# Patient Record
Sex: Female | Born: 1960 | Race: White | Hispanic: No | Marital: Married | Smoking: Former smoker
Health system: Southern US, Community
[De-identification: ages and names within clinical notes are randomized; demographics above are authoritative.]

## PROBLEM LIST (undated history)

## (undated) DIAGNOSIS — R002 Palpitations: Secondary | ICD-10-CM

## (undated) DIAGNOSIS — I2510 Atherosclerotic heart disease of native coronary artery without angina pectoris: Secondary | ICD-10-CM

## (undated) DIAGNOSIS — E785 Hyperlipidemia, unspecified: Secondary | ICD-10-CM

## (undated) DIAGNOSIS — I10 Essential (primary) hypertension: Secondary | ICD-10-CM

## (undated) HISTORY — DX: Essential (primary) hypertension: I10

## (undated) HISTORY — DX: Hyperlipidemia, unspecified: E78.5

## (undated) HISTORY — PX: REDUCTION MAMMAPLASTY: SUR839

## (undated) HISTORY — PX: FOOT SURGERY: SHX648

## (undated) HISTORY — PX: KNEE SURGERY: SHX244

## (undated) HISTORY — DX: Palpitations: R00.2

## (undated) HISTORY — DX: Atherosclerotic heart disease of native coronary artery without angina pectoris: I25.10

---

## 1998-01-24 ENCOUNTER — Other Ambulatory Visit: Admission: RE | Admit: 1998-01-24 | Discharge: 1998-01-24 | Payer: Self-pay | Admitting: Obstetrics and Gynecology

## 1999-02-17 ENCOUNTER — Other Ambulatory Visit: Admission: RE | Admit: 1999-02-17 | Discharge: 1999-02-17 | Payer: Self-pay | Admitting: Obstetrics and Gynecology

## 2000-03-09 ENCOUNTER — Other Ambulatory Visit: Admission: RE | Admit: 2000-03-09 | Discharge: 2000-03-09 | Payer: Self-pay | Admitting: Obstetrics and Gynecology

## 2000-04-09 ENCOUNTER — Encounter: Admission: RE | Admit: 2000-04-09 | Discharge: 2000-04-09 | Payer: Self-pay | Admitting: Obstetrics and Gynecology

## 2000-04-09 ENCOUNTER — Encounter: Payer: Self-pay | Admitting: Obstetrics and Gynecology

## 2001-04-05 ENCOUNTER — Other Ambulatory Visit: Admission: RE | Admit: 2001-04-05 | Discharge: 2001-04-05 | Payer: Self-pay | Admitting: Obstetrics and Gynecology

## 2001-09-07 ENCOUNTER — Encounter: Payer: Self-pay | Admitting: Obstetrics and Gynecology

## 2001-09-07 ENCOUNTER — Encounter: Admission: RE | Admit: 2001-09-07 | Discharge: 2001-09-07 | Payer: Self-pay | Admitting: Obstetrics and Gynecology

## 2002-06-15 ENCOUNTER — Other Ambulatory Visit: Admission: RE | Admit: 2002-06-15 | Discharge: 2002-06-15 | Payer: Self-pay | Admitting: Obstetrics and Gynecology

## 2003-04-17 ENCOUNTER — Ambulatory Visit: Admission: RE | Admit: 2003-04-17 | Discharge: 2003-04-17 | Payer: Self-pay | Admitting: Gynecology

## 2003-09-12 ENCOUNTER — Other Ambulatory Visit: Admission: RE | Admit: 2003-09-12 | Discharge: 2003-09-12 | Payer: Self-pay | Admitting: Obstetrics and Gynecology

## 2004-10-02 ENCOUNTER — Other Ambulatory Visit: Admission: RE | Admit: 2004-10-02 | Discharge: 2004-10-02 | Payer: Self-pay | Admitting: Obstetrics and Gynecology

## 2005-11-12 ENCOUNTER — Encounter: Admission: RE | Admit: 2005-11-12 | Discharge: 2005-11-12 | Payer: Self-pay | Admitting: Gastroenterology

## 2005-12-17 ENCOUNTER — Ambulatory Visit (HOSPITAL_COMMUNITY): Admission: RE | Admit: 2005-12-17 | Discharge: 2005-12-17 | Payer: Self-pay | Admitting: Obstetrics and Gynecology

## 2005-12-18 ENCOUNTER — Ambulatory Visit: Admission: RE | Admit: 2005-12-18 | Discharge: 2005-12-18 | Payer: Self-pay | Admitting: Gynecology

## 2009-07-04 ENCOUNTER — Encounter: Admission: RE | Admit: 2009-07-04 | Discharge: 2009-07-04 | Payer: Self-pay | Admitting: Obstetrics and Gynecology

## 2010-02-25 ENCOUNTER — Encounter: Admission: RE | Admit: 2010-02-25 | Discharge: 2010-02-25 | Payer: Self-pay | Admitting: Obstetrics and Gynecology

## 2010-05-18 ENCOUNTER — Encounter: Payer: Self-pay | Admitting: Gastroenterology

## 2010-05-18 ENCOUNTER — Encounter: Payer: Self-pay | Admitting: Obstetrics and Gynecology

## 2010-09-12 NOTE — Consult Note (Signed)
NAME:  Elizabeth Johnston, Elizabeth Johnston NO.:  [id number]   MEDICAL RECORD NO.:  [id number]          PATIENT TYPE:  OUT   LOCATION:  GYN                          FACILITY:  [facility]   PHYSICIAN:  De Blanch, M.D.DATE OF BIRTH:  [birth year]   DATE OF CONSULTATION:  DATE OF DISCHARGE:                                   CONSULTATION   CHIEF COMPLAINT:  Elevated CA-125.   HISTORY OF PRESENT ILLNESS:  Elizabeth Johnston returns for reevaluation regarding an  elevated CA-125.  I initially saw her in December of 2004 at which time she  had a CA-125 of 79 with 58 units/mL.  Apparently, the CA-125 had been done  as a screening test.  Evaluation at that time suggested that her CA-125 was  likely associated with her uterine fibroids.  She is being followed by Dr.  Arelia Sneddon who recently obtained a CA-125 which was 43 units/mL.  Subsequently,  the patient has had ultrasound of the pelvis showing two small uterine  fibroids, normal left ovary, and her right ovary was not visualized.  There  was no free pelvic fluid.   She is currently having regular cyclic menstrual periods, taking Yaz.   PAST MEDICAL HISTORY:  Elevated cholesterol.   PAST GYN HISTORY:  PMS with breakthrough bleeding.   SOCIAL HISTORY:  Gravida 2.  One child has Down's syndrome.   PAST SURGICAL HISTORY:  Cesarean section, breast reduction surgery.   DRUG ALLERGIES:  None.   FAMILY HISTORY:  Mother with breast cancer.   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   CURRENT MEDICATIONS:  Yaz, Vytorin, Lexapro.   REVIEW OF SYSTEMS:  A 10-point comprehensive review of systems negative  except as noted above.   PHYSICAL EXAMINATION:  VITAL SIGNS:  Blood pressure 124/80, pulse 80,  respiratory rate 20.  Weight 190 pounds.  GENERAL:  The patient is a healthy, pleasant white female in no acute  distress.  HEENT:  Negative.  NECK:  Supple without thyromegaly.  There is no supraclavicular or  inguinal  adenopathy.  ABDOMEN:  Soft and nontender.  No mass, organomegaly, ascites or hernias  noted.  PELVIC:  EG, BUS, vagina, bladder and urethra are normal.  Cervix and uterus  seem to be normal, although the uterus is slightly enlarged.  No adnexal  masses noted.  RECTOVAGINAL:  Confirms.   IMPRESSION:  Slightly elevated CA-125 which is actually lower than it was in  2004.  Her ultrasound suggests uterine fibroids which have been present in  the past and are most likely the cause for elevated CA-125.  I again  reassured the patient I do not believe she has ovarian cancer and would  discontinue using CA-125 as a screening test.  She understands this and is  in agreement.  She will return to see Dr. Arelia Sneddon for regularly scheduled  visit.  She recently has had colonoscopy that was normal.      De Blanch, M.D.  Electronically Signed     DC/MEDQ  D:  12/18/2005  T:  12/19/2005  Job:  [id number]

## 2010-09-12 NOTE — Consult Note (Signed)
NAME:  Elizabeth Johnston, Elizabeth Johnston NO.:  [id number]   MEDICAL RECORD NO.:  [id number]                   PATIENT TYPE:  OUT   LOCATION:  GYN                                  FACILITY:  [facility]   PHYSICIAN:  De Blanch, M.D.         DATE OF BIRTH:  December 16, 1960   DATE OF CONSULTATION:  04/17/2003  DATE OF DISCHARGE:                                   CONSULTATION   REASON FOR CONSULTATION:  A 50 year old white female seen in consultation at  the request of Juluis Mire, M.D. regarding an elevated CA 125 value.   Apparently the patient saw her primary care physician who obtained a  screening CA 125 which returned as 79 units/mL.  She subsequently saw Dr.  Arelia Sneddon who obtained an ultrasound which showed uterine fibroids and a  resolving ovarian cyst.  A repeat CA 125 value was diminished to 58 units  per mL.   The patient denies any pelvic symptoms, she has regular cyclic menstrual  periods.   She has no family history of ovarian or breast cancer except for her mother  who had post menopausal breast cancer at age 74.   The patient herself is in excellent health.   PAST MEDICAL HISTORY:  Medical illnesses, elevated cholesterol.   PAST GYNECOLOGIC HISTORY:  PMS with breakthrough bleeding, she is doing well  at the present time.   PAST OBSTETRICAL HISTORY:  Gravida 2 with 1 cesarean section.  Her oldest  child has Down's syndrome.   PAST SURGICAL HISTORY:  Cesarean section and breast reduction surgery in  1996.   ALLERGIES:  None.   FAMILY HISTORY:  Reveals mother with breast at age 58.   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   CURRENT MEDICATIONS:  Vytorin, Lo/Ovral, Lexapro.   REVIEW OF SYMPTOMS:  Negative except as noted above.   PHYSICAL EXAMINATION:  VITAL SIGNS:  Height 5 foot 7, weight 192 pounds.  Blood pressure 142/102, pulse 64, respiratory rate 16.  GENERAL:  The patient is a healthy white  female in no acute distress.  HEENT:  Negative.  NECK:  Supple without thyromegaly. There was no supraclavicular or inguinal  adenopathy.  ABDOMEN:  Soft, nontender,  no mass, organomegaly, ascites or hernias are  noted.  PELVIC:  EGBUS, vagina, bladder, urethra are normal. The cervix is parous  and normal. The uterus is slightly irregular and approximately 8-[redacted] weeks  gestational size. There are no adnexal masses palpable.  Rectovaginal exam  confirms.   IMPRESSION:  Healthy 50 year old white female with an elevated CA 125 which  on repeat is still elevated but significantly lower.   Based on the ultrasound showing no significant pathology in the ovaries, I  believe the patient does not have ovarian cancer but most likely has an  elevated CA 125 based on presence of fibroids.   I had a lengthy discussion with the patient  regarding the rule of CA 125 and  indicated that this is not a screening tests and that it has a considerable  number of false positives as well as false negatives.  Given the fact that  the CA 125 is diminished and she is asymptomatic and has an essentially  normal ultrasound, I believe that this is most likely secondary to her  fibroids and would recommend no further evaluation of the CA 125 as I expect  it to remain slightly elevated.  The patient is comfortable with this  approach and recommendation and will return to the care of Dr. Arelia Sneddon for  annual examination.                                               De Blanch, M.D.    DC/MEDQ  D:  04/17/2003  T:  04/17/2003  Job:  [id number]   cc:   Juluis Mire, M.D.  [address] [place]  [place]  [place] [address]  Fax: [phone]   Telford Nab, R.N.  [address]  [place], [place] [address]   Andi Hence, M.D.  [facility]  [address]  [place], [place] [address]

## 2011-07-10 ENCOUNTER — Other Ambulatory Visit: Payer: Self-pay | Admitting: Obstetrics and Gynecology

## 2011-07-10 DIAGNOSIS — Z1231 Encounter for screening mammogram for malignant neoplasm of breast: Secondary | ICD-10-CM

## 2011-08-04 ENCOUNTER — Ambulatory Visit
Admission: RE | Admit: 2011-08-04 | Discharge: 2011-08-04 | Disposition: A | Discharge status: Home / Self Care | Payer: BC Managed Care – PPO | Source: Ambulatory Visit | Attending: Obstetrics and Gynecology | Admitting: Obstetrics and Gynecology

## 2011-08-04 DIAGNOSIS — Z1231 Encounter for screening mammogram for malignant neoplasm of breast: Secondary | ICD-10-CM

## 2012-07-14 ENCOUNTER — Other Ambulatory Visit: Payer: Self-pay

## 2012-07-14 DIAGNOSIS — Z9889 Other specified postprocedural states: Secondary | ICD-10-CM

## 2012-07-14 DIAGNOSIS — Z1231 Encounter for screening mammogram for malignant neoplasm of breast: Secondary | ICD-10-CM

## 2012-08-04 ENCOUNTER — Ambulatory Visit
Admission: RE | Admit: 2012-08-04 | Discharge: 2012-08-04 | Disposition: A | Discharge status: Home / Self Care | Payer: BC Managed Care – PPO | Source: Ambulatory Visit

## 2012-08-04 DIAGNOSIS — Z1231 Encounter for screening mammogram for malignant neoplasm of breast: Secondary | ICD-10-CM

## 2012-08-04 DIAGNOSIS — Z9889 Other specified postprocedural states: Secondary | ICD-10-CM

## 2013-01-03 ENCOUNTER — Other Ambulatory Visit (HOSPITAL_COMMUNITY): Payer: BC Managed Care – PPO

## 2013-01-09 ENCOUNTER — Encounter (HOSPITAL_COMMUNITY): Admission: RE | Payer: Self-pay | Source: Ambulatory Visit

## 2013-01-09 ENCOUNTER — Ambulatory Visit (HOSPITAL_COMMUNITY)
Admission: RE | Admit: 2013-01-09 | Payer: BC Managed Care – PPO | Source: Ambulatory Visit | Admitting: Obstetrics and Gynecology

## 2013-01-09 SURGERY — DILATATION & CURETTAGE/HYSTEROSCOPY WITH NOVASURE ABLATION
Anesthesia: Choice

## 2013-07-11 ENCOUNTER — Other Ambulatory Visit: Payer: Self-pay

## 2013-07-11 DIAGNOSIS — Z9889 Other specified postprocedural states: Secondary | ICD-10-CM

## 2013-07-11 DIAGNOSIS — Z1231 Encounter for screening mammogram for malignant neoplasm of breast: Secondary | ICD-10-CM

## 2013-08-14 ENCOUNTER — Ambulatory Visit
Admission: RE | Admit: 2013-08-14 | Discharge: 2013-08-14 | Disposition: A | Discharge status: Home / Self Care | Payer: Self-pay | Source: Ambulatory Visit

## 2013-08-14 DIAGNOSIS — Z9889 Other specified postprocedural states: Secondary | ICD-10-CM

## 2013-08-14 DIAGNOSIS — Z1231 Encounter for screening mammogram for malignant neoplasm of breast: Secondary | ICD-10-CM

## 2014-08-02 ENCOUNTER — Other Ambulatory Visit: Payer: Self-pay

## 2014-08-02 DIAGNOSIS — Z1231 Encounter for screening mammogram for malignant neoplasm of breast: Secondary | ICD-10-CM

## 2014-09-10 ENCOUNTER — Ambulatory Visit
Admission: RE | Admit: 2014-09-10 | Discharge: 2014-09-10 | Disposition: A | Discharge status: Home / Self Care | Payer: BC Managed Care – PPO | Source: Ambulatory Visit

## 2014-09-10 DIAGNOSIS — Z1231 Encounter for screening mammogram for malignant neoplasm of breast: Secondary | ICD-10-CM

## 2014-09-14 ENCOUNTER — Other Ambulatory Visit: Payer: Self-pay | Admitting: Obstetrics and Gynecology

## 2014-09-14 DIAGNOSIS — Z803 Family history of malignant neoplasm of breast: Secondary | ICD-10-CM

## 2014-10-01 ENCOUNTER — Ambulatory Visit
Admission: RE | Admit: 2014-10-01 | Discharge: 2014-10-01 | Disposition: A | Discharge status: Home / Self Care | Payer: BC Managed Care – PPO | Source: Ambulatory Visit | Attending: Obstetrics and Gynecology | Admitting: Obstetrics and Gynecology

## 2014-10-01 DIAGNOSIS — Z803 Family history of malignant neoplasm of breast: Secondary | ICD-10-CM

## 2014-10-01 MED ORDER — GADOBENATE DIMEGLUMINE 529 MG/ML IV SOLN
18.0000 mL | Freq: Once | INTRAVENOUS | Status: AC | PRN
  Administered 2014-10-01: 18 mL via INTRAVENOUS

## 2015-08-30 ENCOUNTER — Other Ambulatory Visit: Payer: Self-pay

## 2015-08-30 DIAGNOSIS — Z1231 Encounter for screening mammogram for malignant neoplasm of breast: Secondary | ICD-10-CM

## 2015-08-30 DIAGNOSIS — Z9889 Other specified postprocedural states: Secondary | ICD-10-CM

## 2015-10-21 ENCOUNTER — Ambulatory Visit
Admission: RE | Admit: 2015-10-21 | Discharge: 2015-10-21 | Disposition: A | Discharge status: Home / Self Care | Payer: BC Managed Care – PPO | Source: Ambulatory Visit

## 2015-10-21 DIAGNOSIS — Z9889 Other specified postprocedural states: Secondary | ICD-10-CM

## 2015-10-21 DIAGNOSIS — Z1231 Encounter for screening mammogram for malignant neoplasm of breast: Secondary | ICD-10-CM

## 2015-10-23 ENCOUNTER — Other Ambulatory Visit: Payer: Self-pay | Admitting: Obstetrics and Gynecology

## 2015-10-23 DIAGNOSIS — R928 Other abnormal and inconclusive findings on diagnostic imaging of breast: Secondary | ICD-10-CM

## 2015-10-25 ENCOUNTER — Ambulatory Visit
Admission: RE | Admit: 2015-10-25 | Discharge: 2015-10-25 | Disposition: A | Discharge status: Home / Self Care | Payer: BC Managed Care – PPO | Source: Ambulatory Visit | Attending: Obstetrics and Gynecology | Admitting: Obstetrics and Gynecology

## 2015-10-25 DIAGNOSIS — R928 Other abnormal and inconclusive findings on diagnostic imaging of breast: Secondary | ICD-10-CM

## 2015-11-04 ENCOUNTER — Other Ambulatory Visit: Payer: BC Managed Care – PPO

## 2016-01-30 ENCOUNTER — Other Ambulatory Visit: Payer: Self-pay | Admitting: Obstetrics and Gynecology

## 2016-01-30 DIAGNOSIS — Z803 Family history of malignant neoplasm of breast: Secondary | ICD-10-CM

## 2016-02-24 ENCOUNTER — Ambulatory Visit
Admission: RE | Admit: 2016-02-24 | Discharge: 2016-02-24 | Disposition: A | Discharge status: Home / Self Care | Payer: BC Managed Care – PPO | Source: Ambulatory Visit | Attending: Obstetrics and Gynecology | Admitting: Obstetrics and Gynecology

## 2016-02-24 DIAGNOSIS — Z803 Family history of malignant neoplasm of breast: Secondary | ICD-10-CM

## 2016-02-24 MED ORDER — GADOBENATE DIMEGLUMINE 529 MG/ML IV SOLN
19.0000 mL | Freq: Once | INTRAVENOUS | Status: AC | PRN
  Administered 2016-02-24: 19 mL via INTRAVENOUS

## 2016-09-14 ENCOUNTER — Other Ambulatory Visit: Payer: Self-pay | Admitting: Obstetrics and Gynecology

## 2016-09-14 DIAGNOSIS — Z1231 Encounter for screening mammogram for malignant neoplasm of breast: Secondary | ICD-10-CM

## 2016-11-03 ENCOUNTER — Ambulatory Visit: Payer: BC Managed Care – PPO

## 2016-11-16 ENCOUNTER — Ambulatory Visit
Admission: RE | Admit: 2016-11-16 | Discharge: 2016-11-16 | Disposition: A | Discharge status: Home / Self Care | Payer: BC Managed Care – PPO | Source: Ambulatory Visit | Attending: Obstetrics and Gynecology | Admitting: Obstetrics and Gynecology

## 2016-11-16 DIAGNOSIS — Z1231 Encounter for screening mammogram for malignant neoplasm of breast: Secondary | ICD-10-CM

## 2017-06-09 ENCOUNTER — Other Ambulatory Visit: Payer: Self-pay | Admitting: Obstetrics and Gynecology

## 2017-06-09 DIAGNOSIS — Z803 Family history of malignant neoplasm of breast: Secondary | ICD-10-CM

## 2017-06-12 ENCOUNTER — Ambulatory Visit
Admission: RE | Admit: 2017-06-12 | Discharge: 2017-06-12 | Disposition: A | Discharge status: Home / Self Care | Payer: BC Managed Care – PPO | Source: Ambulatory Visit | Attending: Obstetrics and Gynecology | Admitting: Obstetrics and Gynecology

## 2017-06-12 DIAGNOSIS — Z803 Family history of malignant neoplasm of breast: Secondary | ICD-10-CM

## 2017-06-12 MED ORDER — GADOBENATE DIMEGLUMINE 529 MG/ML IV SOLN: 19.0000 mL | Freq: Once | INTRAVENOUS | Status: DC | PRN

## 2017-10-12 ENCOUNTER — Other Ambulatory Visit: Payer: Self-pay | Admitting: Obstetrics and Gynecology

## 2017-10-12 DIAGNOSIS — Z1231 Encounter for screening mammogram for malignant neoplasm of breast: Secondary | ICD-10-CM

## 2017-12-06 ENCOUNTER — Ambulatory Visit: Payer: BC Managed Care – PPO

## 2019-01-18 ENCOUNTER — Other Ambulatory Visit: Payer: Self-pay | Admitting: Obstetrics and Gynecology

## 2019-01-18 DIAGNOSIS — Z9189 Other specified personal risk factors, not elsewhere classified: Secondary | ICD-10-CM

## 2019-02-13 ENCOUNTER — Ambulatory Visit
Admission: RE | Admit: 2019-02-13 | Discharge: 2019-02-13 | Disposition: A | Discharge status: Home / Self Care | Payer: BLUE CROSS/BLUE SHIELD | Source: Ambulatory Visit | Attending: Obstetrics and Gynecology | Admitting: Obstetrics and Gynecology

## 2019-02-13 DIAGNOSIS — Z9189 Other specified personal risk factors, not elsewhere classified: Secondary | ICD-10-CM

## 2019-02-13 MED ORDER — GADOBUTROL 1 MMOL/ML IV SOLN
8.0000 mL | Freq: Once | INTRAVENOUS | Status: AC | PRN
  Administered 2019-02-13: 8 mL via INTRAVENOUS

## 2020-03-07 IMAGING — MR MR BREAST BILAT WO/W CM
8 of 12 series · 33 of 48 positions shown · IV contrast (8ml gadavist)
Comparison: Previous exam(s).

CLINICAL DATA: 58-year-old female presenting for high-risk
screening MRI due to family history of breast cancer including
mother at age 50 and sister at age 55. History of remote bilateral
breast reduction.

LABS:  Creatinine was obtained on site at [HOSPITAL] at [REDACTED] [HOSPITAL].
Results: Creatinine 0.6 mg/dL.
EXAM:
BILATERAL BREAST MRI WITH AND WITHOUT CONTRAST
TECHNIQUE: Multiplanar, multisequence MR images of both breasts were obtained
prior to and following the intravenous administration of 8 ml of
Gadavist.

[Series 2: t2_tirm_tra ipat (a-p) · axial · 3.0mm · 0.70mm/px · 1 of 55 slices shown]
[im 1/55]
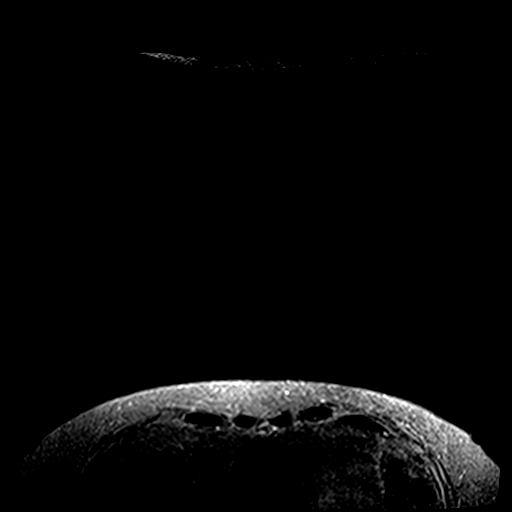

[Series 3: fl3d pre-cm no · axial · non-contrast · 1.2mm · 0.94mm/px · z∈[-64,+108]mm · 5 of 144 slices shown]
[im 1/144]
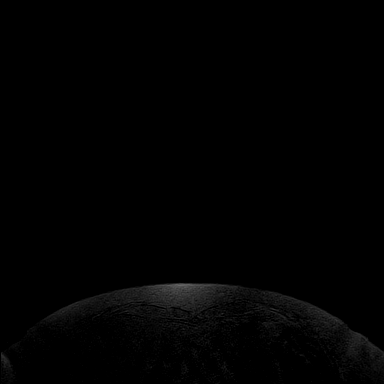
[im 36/144]
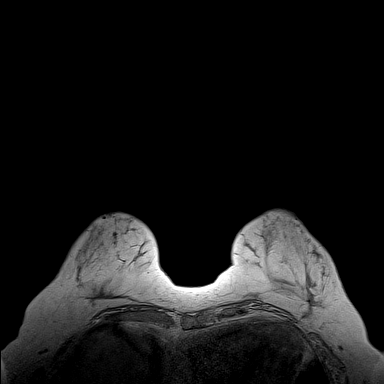
[im 72/144]
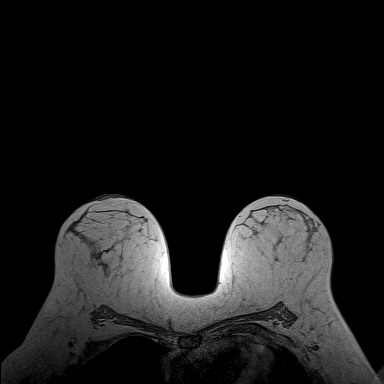
[im 108/144]
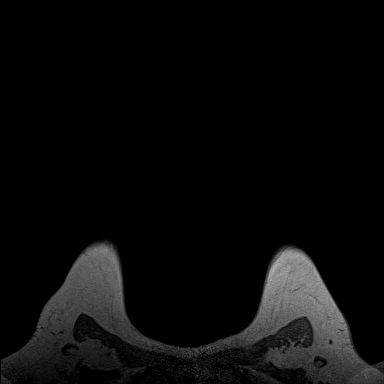
[im 144/144]
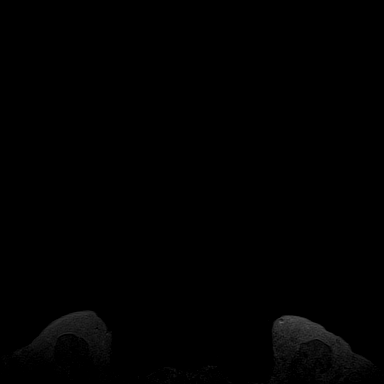

[Series 4: fl3d pre-cm · axial · non-contrast · 1.2mm · 0.94mm/px · z∈[-64,+108]mm · 5 of 144 slices shown]
[im 1/144]
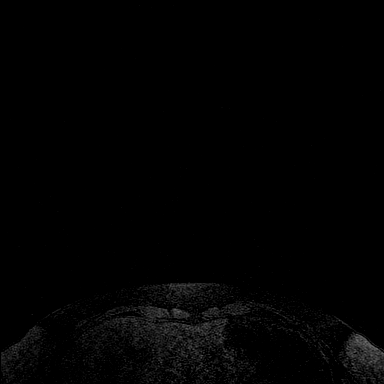
[im 36/144]
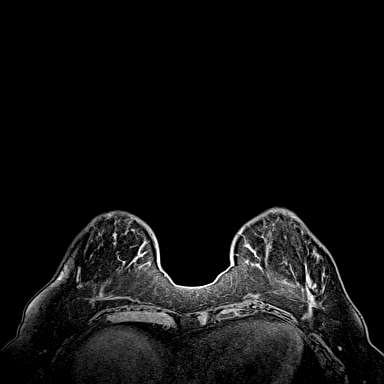
[im 72/144]
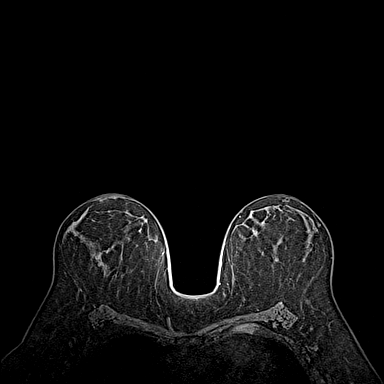
[im 108/144]
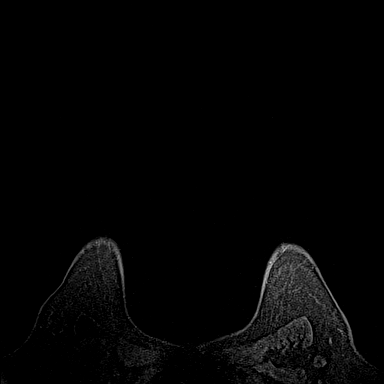
[im 144/144]
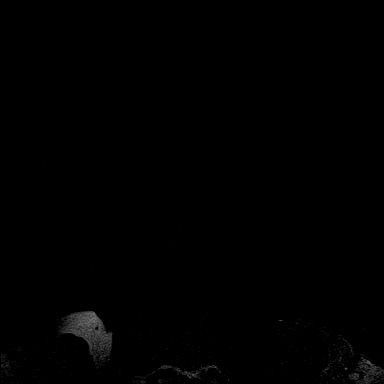

[Series 5: fl3d post-cm 20 · axial · 1.2mm · 0.94mm/px · z∈[-64,+108]mm · 5 of 144 slices shown (1 of 3)]
[im 1/144]
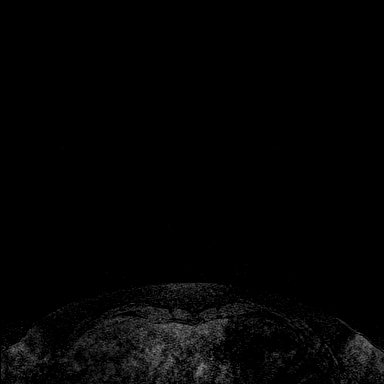
[im 36/144]
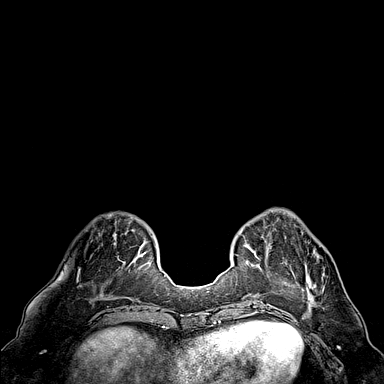
[im 72/144]
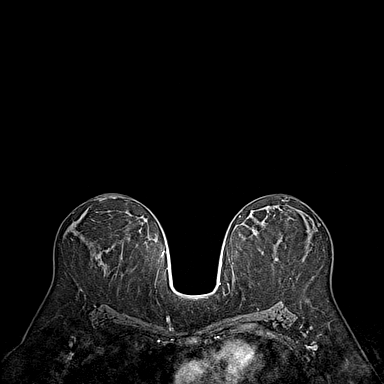
[im 108/144]
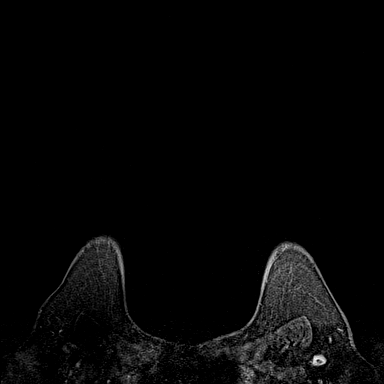
[im 144/144]
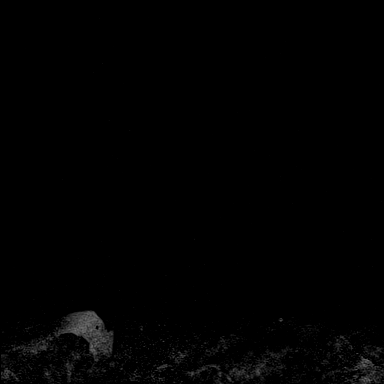

[Series 6: fl3d post-cm 20 · axial · 1.2mm · 0.94mm/px · z∈[-64,+108]mm · 5 of 144 slices shown (2 of 3)]
[im 1/144]
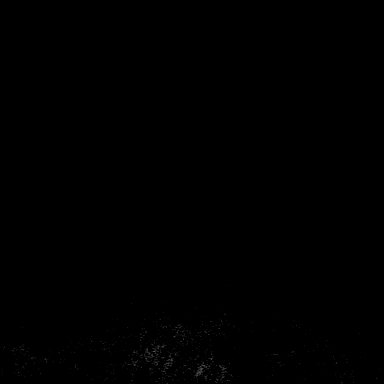
[im 36/144]
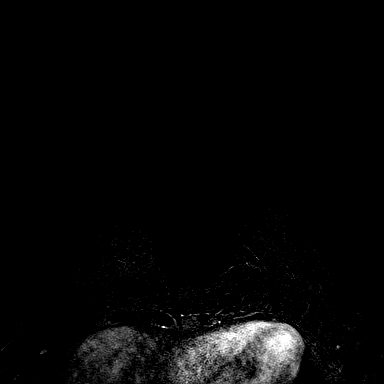
[im 72/144]
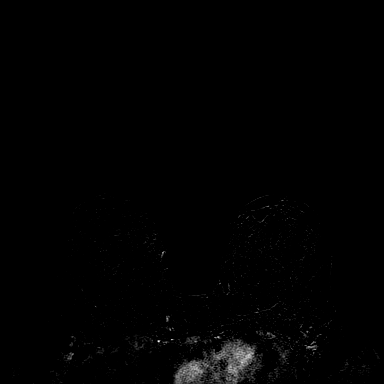
[im 108/144]
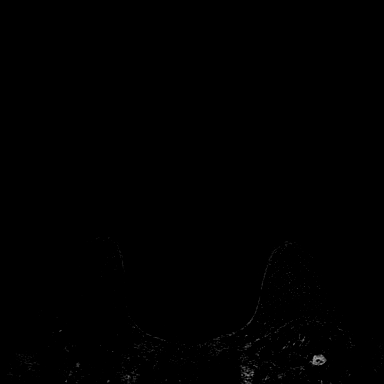
[im 144/144]
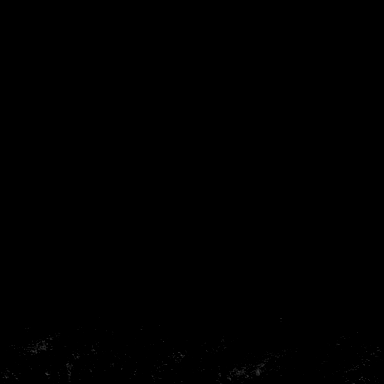

[Series 7: fl3d post-cm 20 · axial · 172.8mm · 0.94mm/px · 1 of 1 slices shown (3 of 3)]
[im 1/1]
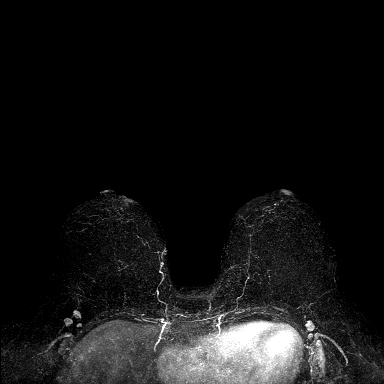

[Series 8: fl3d post-cm 3min · axial · 1.2mm · 0.94mm/px · z∈[-64,+108]mm · 6 of 144 slices shown]
[im 1/144]
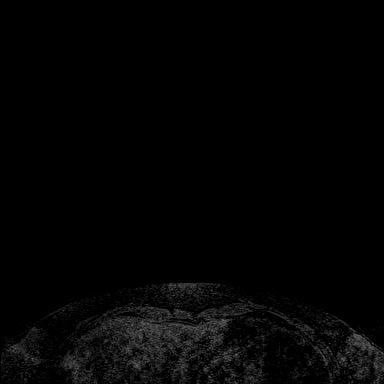
[im 29/144]
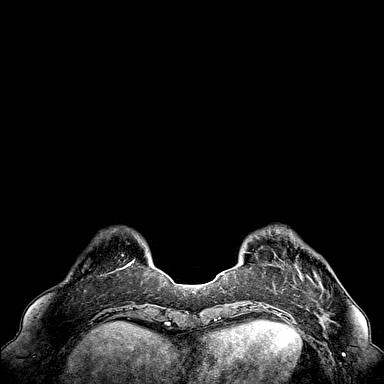
[im 58/144]
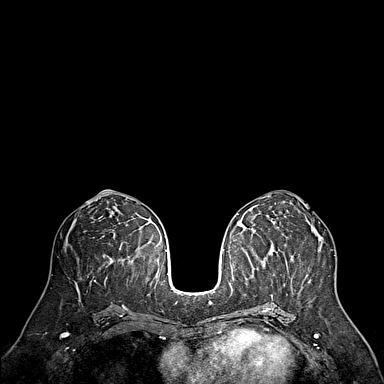
[im 86/144]
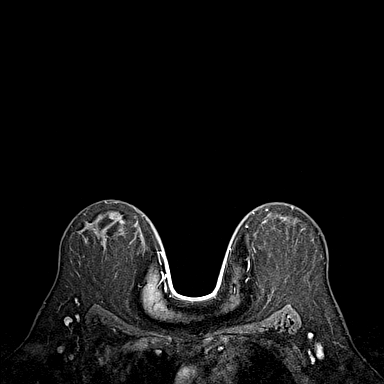
[im 115/144]
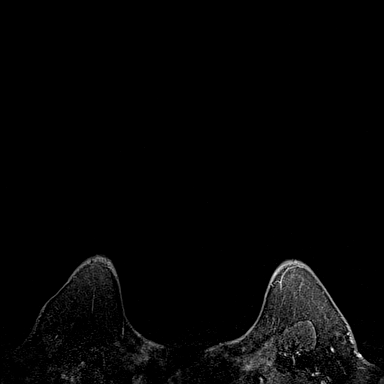
[im 144/144]
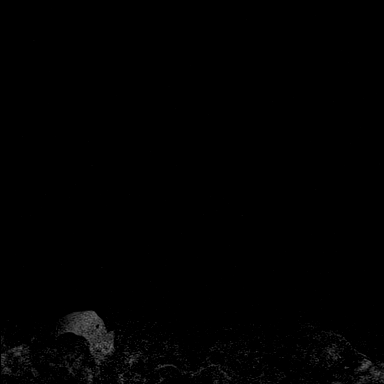

[Series 9: fl3d post-cm 3min_sub · axial · 1.2mm · 0.94mm/px · z∈[-64,+73]mm · 5 of 144 slices shown]
[im 1/144]
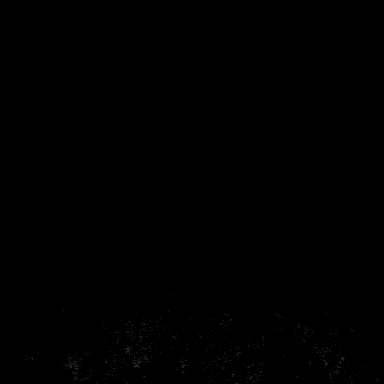
[im 29/144]
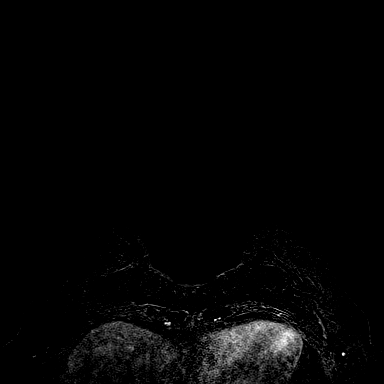
[im 58/144]
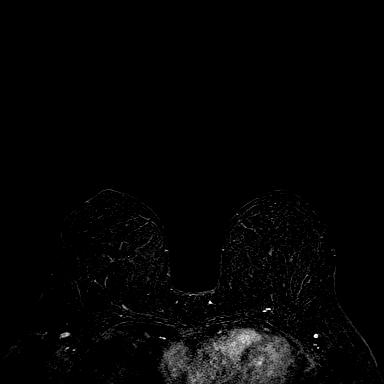
[im 86/144]
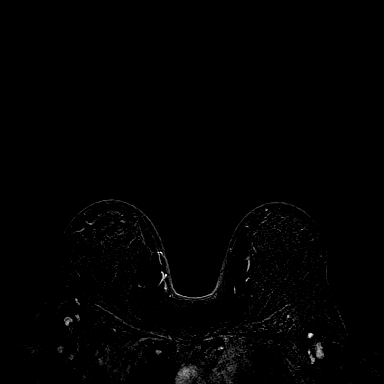
[im 115/144]
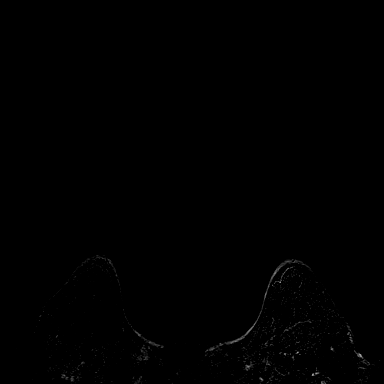

[33 of 48 positions shown; findings below may reference images not displayed]

Three-dimensional MR images were rendered by post-processing of the
original MR data on an independent workstation. The
three-dimensional MR images were interpreted, and findings are
reported in the following complete MRI report for this study. Three
dimensional images were evaluated at the independent [employer]
workstation
FINDINGS: Breast composition: b. Scattered fibroglandular tissue.

Background parenchymal enhancement: Minimal.

Right breast: No mass or abnormal enhancement.

Left breast: No mass or abnormal enhancement.

Lymph nodes: No abnormal appearing lymph nodes.

Ancillary findings:  None.
IMPRESSION: No MRI evidence of malignancy in either breast.

RECOMMENDATION:
Annual screening mammography and MRI.

BI-RADS CATEGORY  1: Negative.

## 2020-08-13 ENCOUNTER — Other Ambulatory Visit: Payer: Self-pay | Admitting: Obstetrics and Gynecology

## 2020-08-13 DIAGNOSIS — Z803 Family history of malignant neoplasm of breast: Secondary | ICD-10-CM

## 2020-09-03 ENCOUNTER — Ambulatory Visit
Admission: RE | Admit: 2020-09-03 | Discharge: 2020-09-03 | Disposition: A | Discharge status: Home / Self Care | Payer: BC Managed Care – PPO | Source: Ambulatory Visit | Attending: Obstetrics and Gynecology | Admitting: Obstetrics and Gynecology

## 2020-09-03 ENCOUNTER — Other Ambulatory Visit: Payer: Self-pay

## 2020-09-03 DIAGNOSIS — Z803 Family history of malignant neoplasm of breast: Secondary | ICD-10-CM

## 2020-09-03 MED ORDER — GADOBUTROL 1 MMOL/ML IV SOLN
8.0000 mL | Freq: Once | INTRAVENOUS | Status: AC | PRN
  Administered 2020-09-03: 8 mL via INTRAVENOUS

## 2022-05-06 ENCOUNTER — Other Ambulatory Visit: Payer: Self-pay | Admitting: Obstetrics and Gynecology

## 2022-05-06 DIAGNOSIS — Z803 Family history of malignant neoplasm of breast: Secondary | ICD-10-CM

## 2022-05-06 DIAGNOSIS — Z8249 Family history of ischemic heart disease and other diseases of the circulatory system: Secondary | ICD-10-CM

## 2022-06-09 ENCOUNTER — Ambulatory Visit
Admission: RE | Admit: 2022-06-09 | Discharge: 2022-06-09 | Disposition: A | Discharge status: Home / Self Care | Payer: No Typology Code available for payment source | Source: Ambulatory Visit | Attending: Obstetrics and Gynecology | Admitting: Obstetrics and Gynecology

## 2022-06-09 DIAGNOSIS — Z8249 Family history of ischemic heart disease and other diseases of the circulatory system: Secondary | ICD-10-CM

## 2022-07-02 NOTE — Progress Notes (Signed)
Referring-John McComb MD Reason for referral-coronary calcification  HPI: 62 year old female for evaluation of coronary calcification at request of Arvella Nigh, MD.  Calcium score February 2024 116 which was 92nd percentile.  Also with history of palpitations and previous monitor showed sinus rhythm with rare PVCs and PACs by report.  Laboratories January 2024 showed TSH 1.6, potassium 4.2, creatinine 0.75, total cholesterol 210 with LDL 123 and HDL 67.  Cardiology now asked to evaluate.  Patient denies dyspnea, chest pain or syncope.  She has occasional palpitations which is longstanding and described as a skip.  Current Outpatient Medications  Medication Sig Dispense Refill   calcium carbonate (TUMS EX) 750 MG chewable tablet Chew 1 tablet by mouth daily.     fenofibrate 160 MG tablet Take 160 mg by mouth daily.     furosemide (LASIX) 40 MG tablet Take 20 mg by mouth daily.     losartan (COZAAR) 25 MG tablet Take 25 mg by mouth daily.     Omega 3 1000 MG CAPS Take 2 capsules by mouth daily.     phentermine 37.5 MG capsule Take 37.5 mg by mouth every morning.     Probiotic Product (PROBIOTIC 10 ULTRA STRENGTH PO) Take 1 tablet by mouth daily.     Study - CAPTIVA - aspirin 81 mg tablet (PI-Sethi) Chew 1 tablet by mouth daily.     venlafaxine XR (EFFEXOR-XR) 75 MG 24 hr capsule Take 75 mg by mouth daily with breakfast.     VITAMIN D, ERGOCALCIFEROL, PO Take 1 tablet by mouth daily.     No current facility-administered medications for this visit.    Allergies  Allergen Reactions   Macrobid [Nitrofurantoin]      Past Medical History:  Diagnosis Date   Coronary artery calcification    Hyperlipidemia    Hypertension    Palpitations     Past Surgical History:  Procedure Laterality Date   CESAREAN SECTION     FOOT SURGERY     KNEE SURGERY     REDUCTION MAMMAPLASTY Bilateral     Social History   Socioeconomic History   Marital status: Married    Spouse name: Not on file    Number of children: 2   Years of education: Not on file   Highest education level: Not on file  Occupational History   Not on file  Tobacco Use   Smoking status: Former    Types: Cigarettes   Smokeless tobacco: Never  Substance and Sexual Activity   Alcohol use: Yes    Comment: Occasionally   Drug use: Not on file   Sexual activity: Not on file  Other Topics Concern   Not on file  Social History Narrative   Not on file   Social Determinants of Health   Financial Resource Strain: Not on file  Food Insecurity: Not on file  Transportation Needs: Not on file  Physical Activity: Not on file  Stress: Not on file  Social Connections: Not on file  Intimate Partner Violence: Not on file    Family History  Problem Relation Age of Onset   Breast cancer Mother    Heart attack Father    Breast cancer Sister     ROS: no fevers or chills, productive cough, hemoptysis, dysphasia, odynophagia, melena, hematochezia, dysuria, hematuria, rash, seizure activity, orthopnea, PND, pedal edema, claudication. Remaining systems are negative.  Physical Exam:   Blood pressure 126/62, pulse 81, height 5\' 6"  (1.676 m), weight 180 lb 6.4  oz (81.8 kg), SpO2 98 %.  General:  Well developed/well nourished in NAD Skin warm/dry Patient not depressed No peripheral clubbing Back-normal HEENT-normal/normal eyelids Neck supple/normal carotid upstroke bilaterally; no bruits; no JVD; no thyromegaly chest - CTA/ normal expansion CV - RRR/normal S1 and S2; no murmurs, rubs or gallops;  PMI nondisplaced Abdomen -NT/ND, no HSM, no mass, + bowel sounds, no bruit 2+ femoral pulses, no bruits Ext-no edema, chords, 2+ DP Neuro-grossly nonfocal  ECG -normal sinus rhythm at a rate of 81, no ST changes.  Personally reviewed  A/P  1 coronary calcification-patient noted to have coronary calcification on recent CT.  She is not having symptoms.  She can walk 3 to 4 miles with having no chest pain or dyspnea.   Plan medical therapy.  Will continue aspirin 81 mg daily.  Add Crestor 40 mg daily.  Continue lifestyle modification.  2 hyperlipidemia-discontinue fenofibrate.  Begin Crestor 40 mg daily.  Check lipids and liver in 8 weeks.  She does have a history of hypertriglyceridemia we may need to resume fenofibrate but will await follow-up results.  3 history of palpitations-not particular bothersome at present.  Will arrange echocardiogram to assess LV function.  4 hypertension-blood pressure controlled.  Continue present medical regimen.  Kirk Ruths, MD

## 2022-07-09 ENCOUNTER — Ambulatory Visit: Payer: BC Managed Care – PPO | Attending: Cardiology | Admitting: Cardiology

## 2022-07-09 ENCOUNTER — Encounter: Payer: Self-pay | Admitting: Cardiology

## 2022-07-09 VITALS — BP 126/62 | HR 81 | Ht 66.0 in | Wt 180.4 lb

## 2022-07-09 DIAGNOSIS — I2584 Coronary atherosclerosis due to calcified coronary lesion: Secondary | ICD-10-CM

## 2022-07-09 DIAGNOSIS — E7800 Pure hypercholesterolemia, unspecified: Secondary | ICD-10-CM

## 2022-07-09 DIAGNOSIS — R002 Palpitations: Secondary | ICD-10-CM

## 2022-07-09 DIAGNOSIS — I2510 Atherosclerotic heart disease of native coronary artery without angina pectoris: Secondary | ICD-10-CM

## 2022-07-09 DIAGNOSIS — I10 Essential (primary) hypertension: Secondary | ICD-10-CM

## 2022-07-09 MED ORDER — ROSUVASTATIN CALCIUM 40 MG PO TABS: 40.0000 mg | ORAL_TABLET | Freq: Every day | ORAL | 3 refills | Status: AC

## 2022-07-09 NOTE — Patient Instructions (Signed)
Medication Instructions:   STOP FENOFIBRATE  START ROSUVASTATIN 40 MG ONCE DAILY  *If you need a refill on your cardiac medications before your next appointment, please call your pharmacy*   Lab Work:  Your physician recommends that you return for lab work in:[address] [place]  If you have labs (blood work) drawn today and your tests are completely normal, you will receive your results only by: [place] (if you have MyChart) OR A paper copy in the mail If you have any lab test that is abnormal or we need to change your treatment, we will call you to review the results.   Testing/Procedures:  Your physician has requested that you have an echocardiogram. Echocardiography is a painless test that uses sound waves to create images of your heart. It provides your doctor with information about the size and shape of your heart and how well your heart's chambers and valves are working. This procedure takes approximately one hour. There are no restrictions for this procedure. Please do NOT wear cologne, perfume, aftershave, or lotions (deodorant is allowed). Please arrive 15 minutes prior to your appointment time. [facility], you and your health needs are our priority.  As part of our continuing mission to provide you with exceptional heart care, we have created designated Provider Care Teams.  These Care Teams include your primary Cardiologist (physician) and Advanced Practice Providers (APPs -  Physician Assistants and Nurse Practitioners) who all work together to provide you with the care you need, when you need it.  We recommend signing up for the patient portal called "MyChart".  Sign up information is provided on this After Visit Summary.  MyChart is used to connect with patients for Virtual Visits (Telemedicine).  Patients are able to view lab/test results, encounter notes, upcoming appointments, etc.  Non-urgent messages can be sent  to your provider as well.   To learn more about what you can do with MyChart, go to [email or web address].    Your next appointment:   12 month(s)  Provider:   Kirk Ruths, MD  IN [place]

## 2022-08-06 ENCOUNTER — Ambulatory Visit (HOSPITAL_COMMUNITY): Payer: BC Managed Care – PPO | Attending: Internal Medicine

## 2022-08-06 DIAGNOSIS — R002 Palpitations: Secondary | ICD-10-CM | POA: Insufficient documentation

## 2022-08-06 DIAGNOSIS — I2510 Atherosclerotic heart disease of native coronary artery without angina pectoris: Secondary | ICD-10-CM | POA: Diagnosis not present

## 2022-08-06 LAB — ECHOCARDIOGRAM COMPLETE
Area-P 1/2: 4.6 cm2
S' Lateral: 2.4 cm

## 2022-08-10 ENCOUNTER — Encounter: Payer: Self-pay | Admitting: *Deleted

## 2022-09-05 LAB — LIPID PANEL
Chol/HDL Ratio: 2.7 ratio (ref 0.0–4.4)
Cholesterol, Total: 131 mg/dL (ref 100–199)
HDL: 49 mg/dL (ref >39)
LDL Chol Calc (NIH): 65 mg/dL (ref 0–99)
Triglycerides: 92 mg/dL (ref 0–149)
VLDL Cholesterol Cal: 17 mg/dL (ref 5–40)

## 2022-09-05 LAB — HEPATIC FUNCTION PANEL
ALT: 21 IU/L (ref 0–32)
AST: 23 IU/L (ref 0–40)
Albumin: 4.5 g/dL (ref 3.8–4.9)
Alkaline Phosphatase: 75 IU/L (ref 44–121)
Bilirubin Total: 0.4 mg/dL (ref 0.0–1.2)
Bilirubin, Direct: 0.11 mg/dL (ref 0.00–0.40)
Total Protein: 6.5 g/dL (ref 6.0–8.5)

## 2022-10-28 ENCOUNTER — Ambulatory Visit
Admission: RE | Admit: 2022-10-28 | Discharge: 2022-10-28 | Disposition: A | Discharge status: Home / Self Care | Payer: BC Managed Care – PPO | Source: Ambulatory Visit | Attending: Obstetrics and Gynecology | Admitting: Obstetrics and Gynecology

## 2022-10-28 DIAGNOSIS — Z803 Family history of malignant neoplasm of breast: Secondary | ICD-10-CM

## 2022-10-28 MED ORDER — GADOPICLENOL 0.5 MMOL/ML IV SOLN
8.0000 mL | Freq: Once | INTRAVENOUS | Status: AC | PRN
  Administered 2022-10-28: 8 mL via INTRAVENOUS

## 2023-09-24 ENCOUNTER — Other Ambulatory Visit: Payer: Self-pay | Admitting: Obstetrics and Gynecology

## 2023-09-24 DIAGNOSIS — Z803 Family history of malignant neoplasm of breast: Secondary | ICD-10-CM

## 2023-11-23 ENCOUNTER — Ambulatory Visit
Admission: RE | Admit: 2023-11-23 | Discharge: 2023-11-23 | Disposition: A | Discharge status: Home / Self Care | Source: Ambulatory Visit | Attending: Obstetrics and Gynecology | Admitting: Obstetrics and Gynecology

## 2023-11-23 DIAGNOSIS — Z803 Family history of malignant neoplasm of breast: Secondary | ICD-10-CM

## 2023-11-23 MED ORDER — GADOPICLENOL 0.5 MMOL/ML IV SOLN
6.0000 mL | Freq: Once | INTRAVENOUS | Status: AC | PRN
  Administered 2023-11-23: 6 mL via INTRAVENOUS

## 2024-10-04 ENCOUNTER — Ambulatory Visit: Admitting: Cardiology
